# Patient Record
Sex: Female | Born: 1950 | Race: White | Hispanic: No | Smoking: Never smoker
Health system: Southern US, Community
[De-identification: ages and names within clinical notes are randomized; demographics above are authoritative.]

## PROBLEM LIST (undated history)

## (undated) DIAGNOSIS — L509 Urticaria, unspecified: Secondary | ICD-10-CM

## (undated) DIAGNOSIS — S343XXA Injury of cauda equina, initial encounter: Secondary | ICD-10-CM

## (undated) DIAGNOSIS — T783XXA Angioneurotic edema, initial encounter: Secondary | ICD-10-CM

## (undated) HISTORY — PX: TONSILLECTOMY: SUR1361

## (undated) HISTORY — DX: Urticaria, unspecified: L50.9

## (undated) HISTORY — DX: Angioneurotic edema, initial encounter: T78.3XXA

## (undated) HISTORY — PX: COLONOSCOPY: SHX174

## (undated) HISTORY — PX: OTHER SURGICAL HISTORY: SHX169

---

## 2011-04-07 ENCOUNTER — Emergency Department (HOSPITAL_COMMUNITY): Payer: Worker's Compensation

## 2011-04-07 ENCOUNTER — Emergency Department (HOSPITAL_COMMUNITY)
Admission: EM | Admit: 2011-04-07 | Discharge: 2011-04-07 | Disposition: A | Discharge status: Home / Self Care | Payer: Worker's Compensation | Attending: Emergency Medicine | Admitting: Emergency Medicine

## 2011-04-07 ENCOUNTER — Encounter (HOSPITAL_COMMUNITY): Payer: Self-pay | Admitting: Emergency Medicine

## 2011-04-07 DIAGNOSIS — M79609 Pain in unspecified limb: Secondary | ICD-10-CM | POA: Insufficient documentation

## 2011-04-07 DIAGNOSIS — W19XXXA Unspecified fall, initial encounter: Secondary | ICD-10-CM

## 2011-04-07 DIAGNOSIS — W010XXA Fall on same level from slipping, tripping and stumbling without subsequent striking against object, initial encounter: Secondary | ICD-10-CM | POA: Insufficient documentation

## 2011-04-07 DIAGNOSIS — S60229A Contusion of unspecified hand, initial encounter: Secondary | ICD-10-CM | POA: Insufficient documentation

## 2011-04-07 DIAGNOSIS — S0003XA Contusion of scalp, initial encounter: Secondary | ICD-10-CM | POA: Insufficient documentation

## 2011-04-07 DIAGNOSIS — Y92410 Unspecified street and highway as the place of occurrence of the external cause: Secondary | ICD-10-CM | POA: Insufficient documentation

## 2011-04-07 DIAGNOSIS — S6990XA Unspecified injury of unspecified wrist, hand and finger(s), initial encounter: Secondary | ICD-10-CM | POA: Insufficient documentation

## 2011-04-07 DIAGNOSIS — S0180XA Unspecified open wound of other part of head, initial encounter: Secondary | ICD-10-CM | POA: Insufficient documentation

## 2011-04-07 DIAGNOSIS — M7989 Other specified soft tissue disorders: Secondary | ICD-10-CM | POA: Insufficient documentation

## 2011-04-07 HISTORY — DX: Injury of cauda equina, initial encounter: S34.3XXA

## 2011-04-07 NOTE — ED Provider Notes (Signed)
History     CSN: [id number]  Arrival date & time 04/07/11  4098   First MD Initiated Contact with Patient 04/07/11 [phone]      Chief Complaint  Patient presents with  . Hand Injury  . Fall    (Consider location/radiation/quality/duration/timing/severity/associated sxs/prior treatment) Patient is a 61 y.o. female presenting with hand injury and fall. The history is provided by the patient.  Hand Injury  The incident occurred less than 1 hour ago. The incident occurred in the street. Injury mechanism: Walking and tripped falling directly onto the concrete. The pain is present in the right hand (Right face). The quality of the pain is described as sharp and throbbing. The pain is at a severity of 7/10. The pain is moderate. The pain has been constant since the incident. She reports no foreign bodies present. The symptoms are aggravated by movement, use and palpation. She has tried elevation and ice for the symptoms. The treatment provided mild relief.  Fall The accident occurred less than 1 hour ago. The fall occurred while walking. She landed on concrete. The volume of blood lost was minimal. The point of impact was the head. The pain is present in the head (Right hand). She was ambulatory at the scene. Pertinent negatives include no visual change, no headaches, no loss of consciousness and no tingling. The symptoms are aggravated by activity and use of the injured limb. She has tried ice for the symptoms.    Past Medical History  Diagnosis Date  . 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 Colonoscopy   . Tonsillectomy   . "sensitive to anesthesia"     Family History  Problem Relation Age of Onset  . Hypertension Mother   . Diabetes Mother   . Hyperlipidemia Mother   . Cancer Father   . Multiple sclerosis Father     History  Substance Use Topics  . Smoking status: Not on file  . Smokeless tobacco: Not  on file  . Alcohol Use: 0.6 oz/week    1 Glasses of wine per week     A glass with dinner    OB History    Grav Para Term Preterm Abortions TAB SAB Ect Mult Living                  Review of Systems  Neurological: Negative for tingling, loss of consciousness and headaches.  All other systems reviewed and are negative.    Allergies  Review of patient's allergies indicates no known allergies.  Home Medications  No current outpatient prescriptions on file.  BP 144/65  Pulse 66  Temp(Src) 98.2 F (36.8 C) (Oral)  Resp 18  Ht 5\' 9"  (1.753 m)  Wt 170 lb (77.111 kg)  BMI 25.10 kg/m2  SpO2 100%  Physical Exam  Nursing note and vitals reviewed. Constitutional: She is oriented to person, place, and time. She appears well-developed and well-nourished. No distress.  HENT:  Head: Normocephalic. Head is with contusion and with laceration.    Eyes: EOM are normal. Pupils are equal, round, and reactive to light.  Neck: No spinous process tenderness and no muscular tenderness present.  Cardiovascular: Normal rate, regular rhythm, normal heart sounds and intact distal pulses.  Exam reveals no friction rub.   No murmur heard. Pulmonary/Chest: Effort normal and breath sounds normal. She has no wheezes. She has no rales.  Abdominal: Soft. Bowel sounds are  normal. She exhibits no distension. There is no tenderness. There is no rebound and no guarding.  Musculoskeletal: Normal range of motion. She exhibits tenderness.       Right hand: She exhibits tenderness, bony tenderness, deformity and swelling. She exhibits normal capillary refill.       Hands:      No edema  Neurological: She is alert and oriented to person, place, and time. No cranial nerve deficit.  Skin: Skin is warm and dry. No rash noted.  Psychiatric: She has a normal mood and affect. Her behavior is normal.    ED Course  Procedures (including critical care time)  Labs Reviewed - No data to display Ct Head Wo  Contrast  04/07/2011  *RADIOLOGY REPORT*  Clinical Data: Fall, headache.  CT HEAD WITHOUT CONTRAST  Technique:  Contiguous axial images were obtained from the base of the skull through the vertex without contrast.  Comparison: None.  Findings: Mild generalized atrophy, slightly more pronounced in the occipital lobes. No acute intracranial abnormality.  Specifically, no hemorrhage, hydrocephalus, mass lesion, acute infarction, or significant intracranial injury.  No acute calvarial abnormality. Visualized paranasal sinuses and mastoids clear.  Orbital soft tissues unremarkable.  IMPRESSION: No acute intracranial abnormality.  Mild generalized atrophy.  Original Report Authenticated By: Cyndie Chime, M.D.   Dg Hand Complete Right  04/07/2011  *RADIOLOGY REPORT*  Clinical Data: Hand injury, fall.  RIGHT HAND - COMPLETE 3+ VIEW  Comparison: None  Findings: No acute bony abnormality.  Specifically, no fracture, subluxation, or dislocation.  Soft tissues are intact.  IMPRESSION: No acute bony abnormality.  Original Report Authenticated By: Cyndie Chime, M.D.     No diagnosis found.    MDM   Patient with a mechanical fall today onto the concrete. She hit her head with some history of being dazed but no LOC. Also injury to her hand that is consistent with a boxer's fracture. When patient flexes the fingers she has an impressive amount of lateral deviation of her fifth digit. No other signs of injury. No neck tenderness or pain. CT of the head and plain films of the hand pending. Patient is on no anticoagulation.  Tetanus up-to-date. Films negative hand wrapped wound cleaned and patient discharged home.        Gwyneth Sprout, MD 04/07/11 1128

## 2011-04-07 NOTE — ED Notes (Signed)
Pt states she tripped over a step and fell, struck the right side of her head. Small abrasion to right brow. C/o pain to right medial aspect of hand with some numbness. Adequate pulse, motor, sensory.

## 2011-04-07 NOTE — ED Notes (Signed)
Pt verbalized understanding of discharge instructions. Ambulated out of department with no difficulty.

## 2011-04-07 NOTE — ED Notes (Signed)
Ace wrap applied to right hand, strong pulse, motor, and sensory before and after application.

## 2011-04-07 NOTE — Discharge Instructions (Signed)
Contusion (Bruise) of Hand An injury to the hand may cause bruises (contusions). Contusions are caused by bleeding from small blood vessels (capillaries) that allow blood to leak out into the muscles, tendons, and surrounding soft tissue. This is followed by swelling and pain (inflammation). Contusions of the hand are common because of the use of hands in daily and recreational activities. Signs of a hand injury include pain, swelling, and a color change. Initially the skin may turn blue to purple in color. As the bruise ages, the color turns yellow and orange. Swelling may decrease the movement of the fingers. Contusions are seen more commonly with:  Contact sports (especially in football, wrestling, and basketball).   Use of medications that thin the blood (anticoagulants).   Use of aspirin and nonsteroidal anti-inflammatory agents that decrease the ability of the blood to clot.   Vitamin deficiencies.   Aging.  DIAGNOSIS  Diagnosis of hand injuries can be made by your own observation. If problems continue, a caregiver may be required for further evaluation and treatment. X-rays may be required to make sure there are no broken bones (fractures). Continued problems may require physical therapy for treatment. RISKS AND COMPLICATIONS  Extensive bleeding and tissue inflammation. This can lead to disability and arthritis-type problems later on if the hand does not heal properly.   Infection of the hand if there are breaks in the skin. This is especially true if the hand injury came from someone's teeth, such as would occur with punching someone in the mouth. This can lead to an infection of the tendons and the membranes surrounding the tendons (sheaths). This infection can have severe complications including a loss of function (a "frozen" hand).   Rupture of the tendons requiring a surgical repair. Failure to repair the tendons can result in loss of function of the hand or fingers.  HOME CARE  INSTRUCTIONS   Apply ice to the injury for 15 to 20 minutes, 3 to 4 times per day. Put the ice in a plastic bag and place a towel between the bag of ice and your skin.   An elastic bandage may be used initially for support and to minimize swelling. Do not wrap the hand too tightly. Do not sleep with the elastic bandage on.   Gentle massage from the fingertips towards the elbow will help keep the swelling down. Gently open and close your fist while doing this to maintain range of motion. Do this only after the first few days, when there is no or minimal pain.   Keep your hand above the level of the heart when swelling and pain are present. This will allow the fluid to drain out of the hand, decreasing the amount of swelling. This will improve healing time.   Try to avoid use of the injured hand (except for gentle range of motion) while the hand is hurting. Do not resume use until instructed by your caregiver. Then begin use gradually, do not increase use to the point of pain. If pain does develop, decrease use and continue the above measures, gradually increasing activities that do not cause discomfort until you achieve normal use.   Only take over-the-counter or prescription medicines for pain, discomfort, or fever as directed by your caregiver.   Follow up with your caregiver as directed. Follow-up care may include orthopedic referrals, physical therapy, and rehabilitation. Any delay in obtaining necessary care could result in delayed healing, or temporary or permanent disability.  REHABILITATION  Begin daily rehabilitation exercises when   an elastic bandage is no longer needed and you are either pain free or only have minimal pain.   Use ice massage for 10 minutes before and after workouts. Put ice in a plastic bag and place a towel between the bag of ice and your skin. Massage the injured area with the ice pack.  SEEK IMMEDIATE MEDICAL CARE IF:   Your pain and swelling increase, or pain is  uncontrolled with medications.   You have loss of feeling in your hand, or your hand turns cold or blue.   An oral temperature above 102 F (38.9 C) develops, not controlled by medication.   Your hand becomes warm to the touch, or you have increased pain with even slight movement of your fingers.   Your hand does not begin to improve in 1 or 2 days.   The skin is broken and signs of infection occur (fluid draining from the contusion, increasing pain, fever, headache, muscle aches, dizziness, or a general ill feeling).   You develop new, unexplained problems, or an increase of the symptoms that brought you to your caregiver.  MAKE SURE YOU:   Understand these instructions.   Will watch your condition.   Will get help right away if you are not doing well or get worse.  Document Released: 07/15/2001 Document Revised: 10/05/2010 Document Reviewed: 07/02/2009 [employer] Patient Information 2012 [employer]. 

## 2012-06-21 ENCOUNTER — Other Ambulatory Visit: Payer: Self-pay | Admitting: Family Medicine

## 2012-06-21 ENCOUNTER — Other Ambulatory Visit (HOSPITAL_COMMUNITY)
Admission: RE | Admit: 2012-06-21 | Discharge: 2012-06-21 | Disposition: A | Discharge status: Home / Self Care | Payer: 59 | Source: Ambulatory Visit | Attending: Family Medicine | Admitting: Family Medicine

## 2012-06-21 DIAGNOSIS — Z01419 Encounter for gynecological examination (general) (routine) without abnormal findings: Secondary | ICD-10-CM | POA: Insufficient documentation

## 2014-02-10 ENCOUNTER — Other Ambulatory Visit: Payer: Self-pay | Admitting: Gastroenterology

## 2015-04-29 ENCOUNTER — Other Ambulatory Visit: Payer: Self-pay | Admitting: Nurse Practitioner

## 2015-04-29 ENCOUNTER — Ambulatory Visit
Admission: RE | Admit: 2015-04-29 | Discharge: 2015-04-29 | Disposition: A | Discharge status: Home / Self Care | Payer: Worker's Compensation | Source: Ambulatory Visit | Attending: Nurse Practitioner | Admitting: Nurse Practitioner

## 2015-04-29 DIAGNOSIS — R609 Edema, unspecified: Secondary | ICD-10-CM

## 2015-04-29 DIAGNOSIS — R52 Pain, unspecified: Secondary | ICD-10-CM

## 2015-04-29 DIAGNOSIS — W19XXXA Unspecified fall, initial encounter: Secondary | ICD-10-CM

## 2016-03-27 ENCOUNTER — Ambulatory Visit (INDEPENDENT_AMBULATORY_CARE_PROVIDER_SITE_OTHER): Payer: 59 | Admitting: Allergy and Immunology

## 2016-03-27 ENCOUNTER — Encounter (INDEPENDENT_AMBULATORY_CARE_PROVIDER_SITE_OTHER): Payer: Self-pay

## 2016-03-27 ENCOUNTER — Encounter: Payer: Self-pay | Admitting: Allergy and Immunology

## 2016-03-27 VITALS — BP 132/70 | HR 86 | Temp 99.7°F | Resp 16 | Ht 67.5 in | Wt 191.6 lb

## 2016-03-27 DIAGNOSIS — T783XXA Angioneurotic edema, initial encounter: Secondary | ICD-10-CM | POA: Diagnosis not present

## 2016-03-27 DIAGNOSIS — J3089 Other allergic rhinitis: Secondary | ICD-10-CM | POA: Insufficient documentation

## 2016-03-27 DIAGNOSIS — T7840XA Allergy, unspecified, initial encounter: Secondary | ICD-10-CM | POA: Diagnosis not present

## 2016-03-27 DIAGNOSIS — L500 Allergic urticaria: Secondary | ICD-10-CM

## 2016-03-27 MED ORDER — LEVOCETIRIZINE DIHYDROCHLORIDE 5 MG PO TABS: 5.0000 mg | ORAL_TABLET | Freq: Every evening | ORAL | 5 refills | Status: DC

## 2016-03-27 MED ORDER — EPINEPHRINE 0.3 MG/0.3ML IJ SOAJ: 0.3000 mg | Freq: Once | INTRAMUSCULAR | 2 refills | Status: AC

## 2016-03-27 NOTE — Patient Instructions (Addendum)
Allergic reaction The patient's history suggests allergic reaction with an unclear trigger. Food allergen skin tests were negative today despite a positive histamine control. The negative predictive value for skin tests is excellent (greater than 95%). We will proceed with in vitro lab studies to clarify the etiology.  The following labs have been ordered: FCeRI antibody, TSH, anti-thyroglobulin antibody, thyroid peroxidase antibody, baseline serum tryptase, C4, C1 esterase inhibitor (quantitative and functional), C1q, factor XII, CBC, CMP, ESR, ANA, and serum specific IgE against nut panel, fish panel, and galactose-alpha-1,3-galactose. An additional lab order for serum tryptase has been provided which is to be kept by the patient to be drawn in the emergency department within 4 hours of symptom onset should symptoms recur.  Should symptoms recur, the patient has been asked to keep a journal to record any foods eaten, beverages consumed, medications taken within a 6 hour period prior to the onset of symptoms, as well as record activities being performed, and environmental conditions. For any symptoms concerning for anaphylaxis, epinephrine is to be administered and 911 is to be called immediately.  A prescription has been provided for epinephrine 0.3 mg autoinjector 2 pack along with instructions for its proper administration.  Recurrent urticaria/pruritus Skin tests to select food allergens were negative today. NSAIDs and emotional stress commonly exacerbate urticaria but are not the underlying etiology in this case. Physical urticarias are negative by history (i.e. pressure-induced, temperature, vibration, solar, etc.). We will rule out other potential etiologies with labs. For symptom relief, patient is to take oral antihistamines as directed.  Laboratory evaluation has been ordered (as above).    The patient will be called with further recommendations after lab results have returned.  A  prescription has been provided for levocetirizine, 78m daily as needed.  Perennial allergic rhinitis  Aeroallergen avoidance measures have been discussed and provided in written form.  A prescription has been provided for levocetirizine (as above).  A prescription has been provided for fluticasone nasal spray, 2 sprays per nostril daily as needed. Proper nasal spray technique has been discussed and demonstrated.  I have also recommended nasal saline spray (i.e., Simply Saline) or nasal saline lavage (i.e., NeilMed) as needed prior to medicated nasal sprays.   When lab results have returned the patient will be called with further recommendations and follow up instructions.

## 2016-03-27 NOTE — Assessment & Plan Note (Signed)
   Aeroallergen avoidance measures have been discussed and provided in written form.  A prescription has been provided for levocetirizine (as above).  A prescription has been provided for fluticasone nasal spray, 2 sprays per nostril daily as needed. Proper nasal spray technique has been discussed and demonstrated.  I have also recommended nasal saline spray (i.e., Simply Saline) or nasal saline lavage (i.e., NeilMed) as needed prior to medicated nasal sprays. 

## 2016-03-27 NOTE — Assessment & Plan Note (Signed)
Skin tests to select food allergens were negative today. NSAIDs and emotional stress commonly exacerbate urticaria but are not the underlying etiology in this case. Physical urticarias are negative by history (i.e. pressure-induced, temperature, vibration, solar, etc.). We will rule out other potential etiologies with labs. For symptom relief, patient is to take oral antihistamines as directed.  Laboratory evaluation has been ordered (as above).    The patient will be called with further recommendations after lab results have returned.  A prescription has been provided for levocetirizine, 5mg  daily as needed.

## 2016-03-27 NOTE — Assessment & Plan Note (Addendum)
The patient's history suggests allergic reaction with an unclear trigger. Food allergen skin tests were negative today despite a positive histamine control. The negative predictive value for skin tests is excellent (greater than 95%). We will proceed with in vitro lab studies to clarify the etiology.  The following labs have been ordered: FCeRI antibody, TSH, anti-thyroglobulin antibody, thyroid peroxidase antibody, baseline serum tryptase, C4, C1 esterase inhibitor (quantitative and functional), C1q, factor XII, CBC, CMP, ESR, ANA, and serum specific IgE against nut panel, fish panel, and galactose-alpha-1,3-galactose. An additional lab order for serum tryptase has been provided which is to be kept by the patient to be drawn in the emergency department within 4 hours of symptom onset should symptoms recur.  Should symptoms recur, the patient has been asked to keep a journal to record any foods eaten, beverages consumed, medications taken within a 6 hour period prior to the onset of symptoms, as well as record activities being performed, and environmental conditions. For any symptoms concerning for anaphylaxis, epinephrine is to be administered and 911 is to be called immediately.  A prescription has been provided for epinephrine 0.3 mg autoinjector 2 pack along with instructions for its proper administration.

## 2016-03-27 NOTE — Progress Notes (Signed)
  New Patient Note  RE: Stephanie Briggs MRN: [id number] DOB: 06/07/1950 Date of Office Visit: 03/27/2016  Referring provider: Sun, Vyvyan, MD Primary care provider: SUN,VYVYAN Y, MD  Chief Complaint: Allergic Reaction; Angioedema; and Urticaria   History of present illness: Stephanie Briggs is a 65 y.o. female seen today in consultation requested by Dorothy Scifres, PA-C. She reports that on 02/07/2016 she developed tongue swelling and throat swelling.  She states that the sensation in her throat felt like "having a goiter" and she had difficulty swallowing but denies dyspnea, chest tightness, or wheezing.  She drank icewater and took diphenhydramine and her symptoms resolved completely within 2 or 3 hours without further medical intervention.  This event occurred in mid morning and prior to the onset of symptoms she had consumed blueberries, smoked salmon, and mixed nuts.  She had consumed the nuts approximately 20 or 30 minutes prior to symptom onset.  She has largely avoided nuts since that time and has not had recurrence of angioedema.  She also reports that over the past year she has experienced recurrent episodes of hives that predominantly occur on her chest, neck, face, or head.  No specific medication, food, skin care product, detergent, soap, or other environmental triggers have been identified.  She currently applies topical corticosteroid and/or takes cetirizine with adequate relief. Ivah experiences nasal congestion, rhinorrhea, sneezing, nasal pruritus, and ocular pruritus as well as occasional sinus pressure.  These symptoms occur year around but are most pronounced in the springtime.   Assessment and plan: Allergic reaction The patient's history suggests allergic reaction with an unclear trigger. Food allergen skin tests were negative today despite a positive histamine control. The negative predictive value for skin tests is excellent (greater than 95%). We will proceed  with in vitro lab studies to clarify the etiology.  The following labs have been ordered: FCeRI antibody, TSH, anti-thyroglobulin antibody, thyroid peroxidase antibody, baseline serum tryptase, C4, C1 esterase inhibitor (quantitative and functional), C1q, factor XII, CBC, CMP, ESR, ANA, and serum specific IgE against nut panel, fish panel, and galactose-alpha-1,3-galactose. An additional lab order for serum tryptase has been provided which is to be kept by the patient to be drawn in the emergency department within 4 hours of symptom onset should symptoms recur.  Should symptoms recur, the patient has been asked to keep a journal to record any foods eaten, beverages consumed, medications taken within a 6 hour period prior to the onset of symptoms, as well as record activities being performed, and environmental conditions. For any symptoms concerning for anaphylaxis, epinephrine is to be administered and 911 is to be called immediately.  A prescription has been provided for epinephrine 0.3 mg autoinjector 2 pack along with instructions for its proper administration.  Recurrent urticaria/pruritus Skin tests to select food allergens were negative today. NSAIDs and emotional stress commonly exacerbate urticaria but are not the underlying etiology in this case. Physical urticarias are negative by history (i.e. pressure-induced, temperature, vibration, solar, etc.). We will rule out other potential etiologies with labs. For symptom relief, patient is to take oral antihistamines as directed.  Laboratory evaluation has been ordered (as above).    The patient will be called with further recommendations after lab results have returned.  A prescription has been provided for levocetirizine, 5mg daily as needed.  Perennial allergic rhinitis  Aeroallergen avoidance measures have been discussed and provided in written form.  A prescription has been provided for levocetirizine (as above).  A prescription has  been provided for   fluticasone nasal spray, 2 sprays per nostril daily as needed. Proper nasal spray technique has been discussed and demonstrated.  I have also recommended nasal saline spray (i.e., Simply Saline) or nasal saline lavage (i.e., NeilMed) as needed prior to medicated nasal sprays.   Meds ordered this encounter  Medications  . levocetirizine (XYZAL) 5 MG tablet    Sig: Take 1 tablet (5 mg total) by mouth every evening.    Dispense:  30 tablet    Refill:  5  . EPINEPHrine (EPIPEN 2-PAK) 0.3 mg/0.3 mL IJ SOAJ injection    Sig: Inject 0.3 mLs (0.3 mg total) into the muscle once.    Dispense:  2 Device    Refill:  2    Place on hold until patient calls.    Diagnostics: Environmental skin testing: Positive to dust mite antigen. Food allergen skin testing:  Negative despite a positive histamine control.    Physical examination: Blood pressure 132/70, pulse 86, temperature 99.7 F (37.6 C), temperature source Oral, resp. rate 16, height 5' 7.5" (1.715 m), weight 191 lb 9.6 oz (86.9 kg), SpO2 95 %.  General: Alert, interactive, in no acute distress. HEENT: TMs pearly gray, turbinates mildly edematous without discharge, post-pharynx moderately erythematous. Neck: Supple without lymphadenopathy. Lungs: Clear to auscultation without wheezing, rhonchi or rales. CV: Normal S1, S2 without murmurs. Abdomen: Nondistended, nontender. Skin: Warm and dry, without lesions or rashes. Extremities:  No clubbing, cyanosis or edema. Neuro:   Grossly intact.  Review of systems:  Review of systems negative except as noted in HPI / PMHx or noted below: Review of Systems  Constitutional: Negative.   HENT: Negative.   Eyes: Negative.   Respiratory: Negative.   Cardiovascular: Negative.   Gastrointestinal: Negative.   Genitourinary: Negative.   Musculoskeletal: Negative.   Skin: Negative.   Neurological: Negative.   Endo/Heme/Allergies: Negative.   Psychiatric/Behavioral: Negative.       Past medical history:  Past Medical History:  Diagnosis Date  . Angio-edema   . 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 Urticaria     Past surgical history:  Past Surgical History:  Procedure Laterality Date  . "Sensitive to anesthesia"    . COLONOSCOPY    . TONSILLECTOMY      Family history: Family History  Problem Relation Age of Onset  . Hypertension Mother   . Diabetes Mother   . Hyperlipidemia Mother   . Cancer Father   . Multiple sclerosis Father   . Allergic rhinitis Neg Hx   . Angioedema Neg Hx   . Asthma Neg Hx   . Eczema Neg Hx   . Immunodeficiency Neg Hx   . Urticaria Neg Hx     Social history: Social History   Social History  . Marital status: Divorced    Spouse name: N/A  . Number of children: N/A  . Years of education: N/A   Occupational History  . Not on file.   Social History Main Topics  . Smoking status: Never Smoker  . Smokeless tobacco: Never Used  . Alcohol use 0.6 oz/week    1 Glasses of wine per week     Comment: A glass with dinner  . Drug use: No  . Sexual activity: Not on file   Other Topics Concern  . Not on file   Social History Narrative  . No narrative on file   Environmental History: The patient lives in an apartment with carpeting throughout and  central air/heat.  There is no known water damage or mold issue in the apartment.  Her place of work does have water damage.  She is a nonsmoker without pets.  Allergies as of 03/27/2016      Reactions   Latex Hives      Medication List       Accurate as of 03/27/16 12:36 PM. Always use your most recent med list.          EPINEPHrine 0.3 mg/0.3 mL Soaj injection Commonly known as:  EPIPEN 2-PAK Inject 0.3 mLs (0.3 mg total) into the muscle once.   levocetirizine 5 MG tablet Commonly known as:  XYZAL Take 1 tablet (5 mg total) by mouth every evening.       Known medication allergies: Allergies  Allergen Reactions   . Latex Hives    I appreciate the opportunity to take part in Miral's care. Please do not hesitate to contact me with questions.  Sincerely,   R. Carter Bobbitt, MD 

## 2016-03-28 ENCOUNTER — Telehealth: Payer: Self-pay

## 2016-03-28 LAB — FACTOR 12 ASSAY: Factor XII Activity: 134 % (ref 50–150)

## 2016-03-28 LAB — C1 ESTERASE INHIBITOR: C1INH SerPl-mCnc: 25 mg/dL (ref 21–39)

## 2016-03-28 NOTE — Telephone Encounter (Signed)
Called [facility] and added Fish Panel [id number] per Dr. Nunzio CobbsBobbitt.

## 2016-04-04 ENCOUNTER — Encounter: Payer: Self-pay | Admitting: Neurology

## 2016-04-04 ENCOUNTER — Ambulatory Visit (INDEPENDENT_AMBULATORY_CARE_PROVIDER_SITE_OTHER): Payer: 59 | Admitting: Neurology

## 2016-04-04 VITALS — BP 142/71 | HR 69 | Ht 68.0 in | Wt 189.2 lb

## 2016-04-04 DIAGNOSIS — G834 Cauda equina syndrome: Secondary | ICD-10-CM

## 2016-04-04 DIAGNOSIS — M545 Low back pain: Secondary | ICD-10-CM | POA: Diagnosis not present

## 2016-04-04 DIAGNOSIS — G8929 Other chronic pain: Secondary | ICD-10-CM

## 2016-04-04 DIAGNOSIS — R32 Unspecified urinary incontinence: Secondary | ICD-10-CM

## 2016-04-04 DIAGNOSIS — R200 Anesthesia of skin: Secondary | ICD-10-CM | POA: Diagnosis not present

## 2016-04-04 NOTE — Patient Instructions (Addendum)
Remember to drink plenty of fluid, eat healthy meals and do not skip any meals. Try to eat protein with a every meal and eat a healthy snack such as fruit or nuts in between meals. Try to keep a regular sleep-wake schedule and try to exercise daily, particularly in the form of walking, 20-30 minutes a day, if you can.   As far as diagnostic testing: MRI Lumbar spine  I would like to see you back after imaging we will decide follow up, sooner if we need to. Please call us with any interim questions, concerns, problems, updates or refill requests.   Our phone number is [phone]. We also have an after hours call service for urgent matters and there is a physician on-call for urgent questions. For any emergencies you know to call 911 or go to the nearest emergency room  What is cauda equina syndrome?-The cauda equina is a bundle of nerves that spread out from the bottom of the spinal cord  Cauda equina syndrome is the medical term for a group of symptoms that happen when some of the nerves in the cauda equina get squeezed or damaged. What are the symptoms of cauda equina syndrome?-The symptoms of cauda equina syndrome include: ?Pain, numbness, or tingling in the lower back and spreading down 1 or both legs ?Leg weakness or a problem called "foot drop," which is when you cannot seem to hold your foot up (for example, while walking) ?Problems with bowel or bladder control ?Problems with sex What causes cauda equina syndrome?-To understand the causes of cauda equina syndrome, it's helpful to first learn a little about the back and spine. ?Vertebrae - A stack of bones that sit on top of one another like a stack of coins. Each of these bones has a hole in the center. When stacked, the bones form a hollow tube that protects the spinal cord. ?Spinal cord and nerves - The spinal cord runs through the vertebrae. Nerves branch from the spinal cord and pass in between the vertebrae. From there they  connect to the arms, the legs, and the organs. (This is why problems in the back can cause leg pain or bladder problems.) ?Discs - Rubbery discs sit in between each of the vertebrae to add cushion and allow movement. The discs that sit between the vertebrae have a tough outer shell and jelly-like center. The outer shell of the discs can sometimes break open, spilling the jelly material inside. This is what doctors call a "herniated disc." ?Muscles, tendons, and ligaments - Together the muscles, tendons, and ligaments are called the "soft tissues" of the back. These soft tissues support the back and help hold it together. Cauda equina syndrome can happen because of changes in the back that affect the nerves. For example, cauda equina syndrome can happen because of: ?A herniated disc - The material from a ruptured disc can press on or irritate the nerves in the cauda equina. ?Infection or inflammation - Different types of infection or inflammation can damage or irritate nerves in the cauda equina. ?Cancer - In people with cancer, tumors can form within the spine and press on the nerves in the cauda equina. ?Spinal stenosis - Spinal stenosis is a condition in which the vertebrae form bumps called bone spurs. These bumps can squeeze or press on the nerves in the cauda equina. In people with spinal stenosis, the discs between the vertebrae also tend to dry up and shrink. That causes the space between the vertebrae to get  smaller, sometimes pinching nerves. Should I see a doctor or nurse?-Yes, if you have symptoms of cauda equina syndrome, see a doctor or nurse right away. Cauda equina syndrome is a medical emergency. Will I need tests?-Yes. Your doctor or nurse will need to find out what is causing your symptoms. You will likely have an MRI. This is an imaging test that can create detailed pictures of the spinal cord and nearby tissues. How is cauda equina syndrome treated?-Treatment for cauda equina  syndrome involves treating whatever is affecting the nerves and causing the symptoms. Often, that means having surgery to remove bits of bone or discs, or tumors. If the cause of the cauda equina syndrome is a tumor, radiation to shrink the tumor might be an option. If the cause is an infection or inflammation, medicines to treat those problems might be needed.

## 2016-04-04 NOTE — Progress Notes (Signed)
[facility]    Provider:  Dr Lucia Gaskins Referring Provider: Orbie Hurst, Peter Minium Primary Care Physician:  Leanor Rubenstein, MD  CC:  Lightheadedness and cauda equina syndrome  HPI:  Stephanie Briggs is a 66 y.o. female here as a referral from Dr. Orbie Hurst for lightheadedness and cauda equina Patient has a past medical history of cauda equina syndrome, diarrhea, irregular heart rate. She was working in [place] in 2012. She had acute back issues and went to a sports medicine doctor and she went to the hospital for an MRI. She was diagnosed with Cauda Equina syndrome and she had PT for 6 months. She has numbness in the saddle area, worsening in the last few years, she has urinary incontinence and less sensation and warning with urination, she has a lot of numbness in the groin area. No weakness in the legs. Father had MS and Alzheimers. [place] is 56 and wonderful. She has lightheadedness sometimes, no significant issue, a little bit of memory fog, no particular complaints. She has tried OTC medication, heat, stretching, for years and the symptoms get worse. No tongoing, periodically. Worse with not eating or stress.   Reviewed notes, labs and imaging from outside physicians, which showed:  TSH 4.16January 2018, CBC with differential unremarkable January 2018, CMP unremarkable with BUN 17 and creatinine 0.83 November 2015, B12 653 November 2015  Patient reports story of "incidental cauda equina" seen on imaging in the past while living in [place]. She completed 4 months of physical therapy and has not ad regular back pain since. She's had several episodes of back pain recently along wit She requested referral to neurology. She developed tongue swelling which radiated to the neck and now she  Sensation a She continues to have difficulty making some sounds and she continues to have increased saliva production and has felt slightly spacey.  Reviewed images CT head and agree with the  following: 04/2011 Findings: Mild generalized atrophy, slightly more pronounced in the occipital lobes. No acute intracranial abnormality.  Specifically, no hemorrhage, hydrocephalus, mass lesion, acute infarction, or significant intracranial injury.  No acute calvarial abnormality. Visualized paranasal sinuses and mastoids clear.  Orbital soft tissues unremarkable.  IMPRESSION: No acute intracranial abnormality.  Mild generalized atrophy.  Review of Systems: Patient complains of symptoms per HPI as well as the following symptoms: no CP, no SOB. Pertinent negatives per HPI. All others negative.   Social History   Social History  . Marital status: Significant Other    Spouse name: N/A  . Number of children: 1  . Years of education: MPA   Occupational History  . [employer]    Social History Main Topics  . Smoking status: Never Smoker  . Smokeless tobacco: Never Used     Comment: Tried as a teenager  . Alcohol use 1.8 oz/week    3 Glasses of wine per week     Comment: A glass with dinner  . Drug use: No  . Sexual activity: Yes   Other Topics Concern  . Not on file   Social History Narrative   Lives at home alone   Right-handed   Caffeine: 1 large coffee per day + tea    Family History  Problem Relation Age of Onset  . Hypertension [place]   . Diabetes [place]   . Hyperlipidemia [place]   . Cancer Father     prostate, kidney, bladder  . Stroke Father   . Heart disease Father   . Colon cancer Paternal  Grandfather   . Allergic rhinitis Neg Hx   . Angioedema Neg Hx   . Asthma Neg Hx   . Eczema Neg Hx   . Immunodeficiency Neg Hx   . Urticaria Neg Hx     Past Medical History:  Diagnosis Date  . Angio-edema   . 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 Urticaria     Past Surgical History:  Procedure Laterality Date  . "Sensitive to anesthesia"    . COLONOSCOPY    . TONSILLECTOMY      Current  Outpatient Prescriptions  Medication Sig Dispense Refill  . levocetirizine (XYZAL) 5 MG tablet Take 1 tablet (5 mg total) by mouth every evening. (Patient not taking: Reported on 04/04/2016) 30 tablet 5   No current facility-administered medications for this visit.     Allergies as of 04/04/2016 - Review Complete 04/04/2016  Allergen Reaction Noted  . Latex Hives 03/27/2016    Vitals: BP (!) 142/71   Pulse 69   Ht 5\' 8"  (1.727 m)   Wt 189 lb 3.2 oz (85.8 kg)   BMI 28.77 kg/m  Last Weight:  Wt Readings from Last 1 Encounters:  04/04/16 189 lb 3.2 oz (85.8 kg)   Last Height:   Ht Readings from Last 1 Encounters:  04/04/16 5\' 8"  (1.727 m)   Physical exam: Exam: Gen: NAD, conversant, well nourised, obese, well groomed                     CV: RRR, no MRG. No Carotid Bruits. No peripheral edema, warm, nontender Eyes: Conjunctivae clear without exudates or hemorrhage  Neuro: Detailed Neurologic Exam  Speech:    Speech is normal; fluent and spontaneous with normal comprehension.  Cognition:    The patient is oriented to person, place, and time;     recent and remote memory intact;     language fluent;     normal attention, concentration,     fund of knowledge Cranial Nerves:    The pupils are equal, round, and reactive to light. The fundi are normal and spontaneous venous pulsations are present. Visual fields are full to finger confrontation. Extraocular movements are intact. Trigeminal sensation is intact and the muscles of mastication are normal. The face is symmetric. The palate elevates in the midline. Hearing intact. Voice is normal. Shoulder shrug is normal. The tongue has normal motion without fasciculations.   Coordination:    Normal finger to nose and heel to shin. Normal rapid alternating movements.   Gait:    Heel-toe and tandem gait are normal.   Motor Observation:    No asymmetry, no atrophy, and no involuntary movements noted. Tone:    Normal muscle tone.     Posture:    Posture is normal. normal erect    Strength: prox LE weakness otherwise strength is V/V in the upper and lower limbs.      Sensation: Saddle anesthesias     Reflex Exam:  DTR's:    Deep tendon reflexes in the upper and lower extremities are normal bilaterally.   Toes:    The toes are downgoing bilaterally.   Clonus:    Clonus is absent.       Assessment/Plan:  66 year old female with a past medical history of cauda equina syndrome here for worsening weakness in the legs, saddle anesthesia, Incontinence. Need an MRI of the lumbar spine to further evaluate for cauda equina syndrome and surgical options.  Patient instructed to proceed to the emergency room for any worsening symptoms.  Stephanie DeanAntonia Xhaiden Coombs, MD  [facility] [address] [place], [place] [id number]  Phone [phone] Fax [phone]

## 2016-04-05 ENCOUNTER — Telehealth: Payer: Self-pay | Admitting: Allergy and Immunology

## 2016-04-05 LAB — COMPREHENSIVE METABOLIC PANEL
ALT: 36 IU/L — ABNORMAL HIGH (ref 0–32)
AST: 26 IU/L (ref 0–40)
Albumin/Globulin Ratio: 1.8 (ref 1.2–2.2)
Albumin: 4.8 g/dL (ref 3.6–4.8)
Alkaline Phosphatase: 69 IU/L (ref 39–117)
BUN/Creatinine Ratio: 20 (ref 12–28)
BUN: 18 mg/dL (ref 8–27)
Bilirubin Total: 1 mg/dL (ref 0.0–1.2)
CO2: 24 mmol/L (ref 18–29)
Calcium: 10 mg/dL (ref 8.7–10.3)
Chloride: 100 mmol/L (ref 96–106)
Creatinine, Ser: 0.88 mg/dL (ref 0.57–1.00)
GFR calc Af Amer: 81 mL/min/{1.73_m2} (ref >59)
GFR calc non Af Amer: 70 mL/min/{1.73_m2} (ref >59)
Globulin, Total: 2.6 g/dL (ref 1.5–4.5)
Glucose: 103 mg/dL — ABNORMAL HIGH (ref 65–99)
Potassium: 4.1 mmol/L (ref 3.5–5.2)
Sodium: 142 mmol/L (ref 134–144)
Total Protein: 7.4 g/dL (ref 6.0–8.5)

## 2016-04-05 LAB — CBC WITH DIFFERENTIAL/PLATELET
Basophils Absolute: 0 10*3/uL (ref 0.0–0.2)
Basos: 0 %
EOS (ABSOLUTE): 0.1 10*3/uL (ref 0.0–0.4)
Eos: 1 %
Hematocrit: 44.6 % (ref 34.0–46.6)
Hemoglobin: 15 g/dL (ref 11.1–15.9)
Immature Grans (Abs): 0 10*3/uL (ref 0.0–0.1)
Immature Granulocytes: 0 %
Lymphocytes Absolute: 2.2 10*3/uL (ref 0.7–3.1)
Lymphs: 44 %
MCH: 30.5 pg (ref 26.6–33.0)
MCHC: 33.6 g/dL (ref 31.5–35.7)
MCV: 91 fL (ref 79–97)
Monocytes Absolute: 0.4 10*3/uL (ref 0.1–0.9)
Monocytes: 8 %
Neutrophils Absolute: 2.3 10*3/uL (ref 1.4–7.0)
Neutrophils: 47 %
Platelets: 232 10*3/uL (ref 150–379)
RBC: 4.92 x10E6/uL (ref 3.77–5.28)
RDW: 13.6 % (ref 12.3–15.4)
WBC: 5.1 10*3/uL (ref 3.4–10.8)

## 2016-04-05 LAB — CHRONIC URTICARIA: cu index: <3 (ref <10)

## 2016-04-05 LAB — ALLERGENS(7)
Brazil Nut IgE: <0.1 kU/L
F020-IgE Almond: <0.1 kU/L
F202-IgE Cashew Nut: <0.1 kU/L
Hazelnut (Filbert) IgE: <0.1 kU/L
Peanut IgE: <0.1 kU/L
Pecan Nut IgE: <0.1 kU/L
Walnut IgE: <0.1 kU/L

## 2016-04-05 LAB — ALPHA-GAL PANEL
Alpha Gal IgE*: <0.1 kU/L (ref <0.35)
Beef (Bos spp) IgE: <0.1 kU/L (ref <0.35)
Class Interpretation: 0
Class Interpretation: 0
Class Interpretation: 0
Lamb/Mutton (Ovis spp) IgE: <0.1 kU/L (ref <0.35)
Pork (Sus spp) IgE: <0.1 kU/L (ref <0.35)

## 2016-04-05 LAB — C1 ESTERASE INHIBITOR, FUNCTIONAL: C1INH Functional/C1INH Total MFr SerPl: >86 %mean normal

## 2016-04-05 LAB — TRYPTASE: Tryptase: 5.1 ug/L (ref 2.2–13.2)

## 2016-04-05 LAB — C4 COMPLEMENT: Complement C4, Serum: 34 mg/dL (ref 14–44)

## 2016-04-05 LAB — ANA W/REFLEX IF POSITIVE: Anti Nuclear Antibody(ANA): NEGATIVE

## 2016-04-05 LAB — COMPLEMENT COMPONENT C1Q: Complement C1Q: 15.4 mg/dL (ref 11.8–24.4)

## 2016-04-05 NOTE — Telephone Encounter (Signed)
Patient is returning a call about results.  

## 2016-04-06 ENCOUNTER — Telehealth: Payer: Self-pay | Admitting: Neurology

## 2016-04-06 NOTE — Telephone Encounter (Signed)
Left message for patient to call office.  

## 2016-04-06 NOTE — Telephone Encounter (Signed)
Pt returned Emily's call °

## 2016-04-07 ENCOUNTER — Encounter: Payer: Self-pay | Admitting: *Deleted

## 2016-04-07 NOTE — Telephone Encounter (Signed)
I spoke with Velna HatchetSheila and scheduled her MRI for 04/12/16 at our [facility] mobile unit.

## 2016-04-07 NOTE — Telephone Encounter (Signed)
See result note.  

## 2016-04-07 NOTE — Telephone Encounter (Signed)
Pt returned Emily's call. She can be reached at [phone] or (w) [phone]- she works 1/2 day today. I relayed VM was full and to check it.

## 2016-04-07 NOTE — Telephone Encounter (Signed)
I called the patient back and it went to her voicemail but then it said the mail box was full and I couldn't leave a message.

## 2016-04-12 ENCOUNTER — Ambulatory Visit (INDEPENDENT_AMBULATORY_CARE_PROVIDER_SITE_OTHER): Payer: 59

## 2016-04-12 DIAGNOSIS — R32 Unspecified urinary incontinence: Secondary | ICD-10-CM

## 2016-04-12 DIAGNOSIS — R200 Anesthesia of skin: Secondary | ICD-10-CM

## 2016-04-12 DIAGNOSIS — G834 Cauda equina syndrome: Secondary | ICD-10-CM

## 2016-04-12 DIAGNOSIS — G8929 Other chronic pain: Secondary | ICD-10-CM

## 2016-04-12 DIAGNOSIS — M545 Low back pain: Secondary | ICD-10-CM

## 2016-04-17 ENCOUNTER — Telehealth: Payer: Self-pay | Admitting: *Deleted

## 2016-04-17 NOTE — Telephone Encounter (Signed)
Left message for a return call to discuss results. 

## 2016-04-17 NOTE — Telephone Encounter (Signed)
-----   Message from Anson FretAntonia B Ahern, MD sent at 04/15/2016  5:24 PM EST ----- MRI lumbar spine with some degenerative/arthritic changes but no evidence of cauda equina syndrome. There may be some mild left L5 nerve root pinching.  Thanks.

## 2016-04-19 LAB — SPECIMEN STATUS REPORT

## 2016-04-19 LAB — ALLERGEN PROFILE, FOOD-FISH
Allergen Mackerel IgE: <0.1 kU/L
Allergen Salmon IgE: <0.1 kU/L
Allergen Trout IgE: <0.1 kU/L
Allergen Walley Pike IgE: <0.1 kU/L
Codfish IgE: <0.1 kU/L
Halibut IgE: <0.1 kU/L
Tuna: <0.1 kU/L

## 2016-04-19 NOTE — Telephone Encounter (Signed)
Pt returned call for MRI results. Verbalized understanding and appreciation for call.

## 2016-04-21 ENCOUNTER — Encounter: Payer: Self-pay | Admitting: Neurology

## 2016-04-21 ENCOUNTER — Encounter: Payer: Self-pay | Admitting: Allergy and Immunology

## 2016-04-21 DIAGNOSIS — R32 Unspecified urinary incontinence: Secondary | ICD-10-CM

## 2016-04-21 DIAGNOSIS — M545 Low back pain: Secondary | ICD-10-CM

## 2016-04-21 DIAGNOSIS — R200 Anesthesia of skin: Secondary | ICD-10-CM

## 2016-04-24 NOTE — Addendum Note (Signed)
Addended by: Donnelly AngelicaHOGAN, Ahtziri Jeffries L on: 04/24/2016 11:52 AM   Modules accepted: Orders

## 2016-05-03 ENCOUNTER — Ambulatory Visit: Payer: 59

## 2016-05-09 ENCOUNTER — Encounter: Payer: Self-pay | Admitting: Physical Therapy

## 2016-05-09 ENCOUNTER — Ambulatory Visit: Payer: 59 | Attending: Neurology | Admitting: Physical Therapy

## 2016-05-09 DIAGNOSIS — N393 Stress incontinence (female) (male): Secondary | ICD-10-CM | POA: Insufficient documentation

## 2016-05-09 DIAGNOSIS — M6281 Muscle weakness (generalized): Secondary | ICD-10-CM | POA: Diagnosis not present

## 2016-05-09 DIAGNOSIS — R279 Unspecified lack of coordination: Secondary | ICD-10-CM | POA: Diagnosis present

## 2016-05-09 NOTE — Patient Instructions (Signed)
Balloon Breath    Place hands LIGHTLY on belly below navel. Imagine a balloon inside belly. Blow up balloon on breath IN, deflate balloon on breath OUT. Contract abdominals slightly to assist breath OUT. Time __2_ minutes.  Copyright  VHI. All rights reserved.     Ball squeezes 10 x holding for 5 sec - you can use different size balls or just use a pillow    Transverse Abdominus Activation  Contract your lower abdominals as if you were trying to lift one leg from the table.  Initiate the movement but do no lift foot greater than 1 inch from the table.  Repeat opposite side. Repeat 20x each side  [employer] - look at pelvic floor strengthening and yoga videos

## 2016-05-15 NOTE — Therapy (Addendum)
[facility] [address] [place], [place], [address] Phone: [phone]   Fax:  [phone]  Physical Therapy Evaluation  Patient Details  Name: [place]gs MRN: [id number] Date of Birth: 27-Dec-1950 Referring Provider: Melvenia Beam  Encounter Date: 05/09/2016      PT End of Session - 05/15/16 1743    Visit Number 1   Date for PT Re-Evaluation 07/04/16   PT Start Time 1147   PT Stop Time 1236   PT Time Calculation (min) 49 min   Activity Tolerance Patient tolerated treatment well   Behavior During Therapy [facility] for tasks assessed/performed      Past Medical History:  Diagnosis Date  . Angio-edema   . 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 Urticaria     Past Surgical History:  Procedure Laterality Date  . "Sensitive to anesthesia"    . COLONOSCOPY    . TONSILLECTOMY      There were no vitals filed for this visit.       Subjective Assessment - 05/15/16 1740    Subjective Pt had cauda equina per MRI in [place] years ago.  Recent MRI shows no cauda equina but does have disc compression.  Has been doing exercise with yoga instructor.  Has a standing desk to avoid sitting too much. Last few years it was worse has experienced some stresses in life that have led to chronic fatigue   Limitations Sitting;Lifting   How long can you sit comfortably? 2 hours   How long can you walk comfortably? gets tired   Patient Stated Goals Know what she can do or not do to be safe from low back pain   Currently in Pain? Yes   Pain Score 2    Pain Location Back   Pain Orientation Mid;Lower   Pain Descriptors / Indicators Aching;Dull   Pain Type Chronic pain   Pain Onset More than a month ago   Pain Frequency Constant   Aggravating Factors  quick movement, sneezing, leaning forward   Pain Relieving Factors moving or changing positions   Effect of Pain on Daily Activities  none but not sure how to exercise   Multiple Pain Sites No            [facility] PT Assessment - 05/15/16 0001      Assessment   Medical Diagnosis R20.0 saddle anesthesia, R32 urinary incontinence, M54.5 midline low back pain   Onset Date/Surgical Date --  several years ago   Prior Therapy no     Precautions   Precautions None     Restrictions   Weight Bearing Restrictions No     Home Environment   Living Environment Private residence   Living Arrangements Spouse/significant other     Prior Function   Level of Independence Independent   Vocation Full time employment  55-60 hrs/week   Vocation Requirements sitting and standing     Cognition   Overall Cognitive Status Within Functional Limits for tasks assessed     Observation/Other Assessments   Focus on Therapeutic Outcomes (FOTO)  30% limited     AROM   Overall AROM  Deficits  lumbar flexion 50% limited and needs UE support up to stand     Strength   Overall Strength Deficits  hip add Rt 3/5; Lt 4/5; core 3/5     Palpation   Palpation comment difficulty  Pelvic Floor Special Questions - 05/15/16 0001    Urinary urgency Yes   Urinary frequency 3-4          [facility] Adult PT Treatment/Exercise - 05/15/16 0001      Ambulation/Gait   Gait Pattern Within Functional Limits     Posture/Postural Control   Posture/Postural Control Postural limitations   Postural Limitations Anterior pelvic tilt     Neuro Re-ed    Neuro Re-ed Details  initial HEP educated and performed                PT Education - 05/15/16 1742    Education provided Yes   Education Details see handout   Person(s) Educated Patient   Methods Explanation;Tactile cues;Verbal cues;Handout   Comprehension Verbalized understanding;Returned demonstration          PT Short Term Goals - 05/15/16 1726      PT SHORT TERM GOAL #1   Title independnet with initial HEP   Time 4   Period Weeks   Status New      PT SHORT TERM GOAL #2   Title be able to perform pelvic floor contraction without contracting glutes for improved coordination and stability during funcitonal movements   Time 4   Period Weeks   Status New           PT Long Term Goals - 05/15/16 1728      PT LONG TERM GOAL #1   Title be able to get in and out of bed without increased pain or difficulty due to improved core strength   Time 8   Period Weeks   Status New     PT LONG TERM GOAL #2   Title pt is confident to perform exercises that will be safe for her in order to advance her HEP independently   Time 8   Period Weeks   Status New     PT LONG TERM GOAL #3   Title pt will be able to resume regular exercise routine of walking without increased pain   Time 8   Period Weeks   Status New               Plan - 05/15/16 1721    Clinical Impression Statement Pt was seen for low complexity evaluation due to stable conditon.  Pt demonstrates weakness of core during functional movements and weakness of bilaterally from 3+/5. Pt has pain in low back that is constantly 2/10 . Pt is unsure of how she can move and what activities she can do so that she won't aggravate her pain. Pt has abnormal posture with increased anterior pelvic rotation  She demonstrates improved ease of hip flexion with compression of pelvis showing that she lacks stability in her core.  Pt will benefit from skilled PT to work on improving strength and muscle coordination for return to functional activities as part of healthy lifestyle.   Rehab Potential Good   PT Frequency 2x / week   PT Duration 8 weeks   PT Treatment/Interventions ADLs/Self Care Home Management;Biofeedback;Cryotherapy;Electrical Stimulation;Moist Heat;Stair training;Gait training;Therapeutic activities;Therapeutic exercise;Neuromuscular re-education;Patient/family education;Manual techniques;Passive range of motion;Dry needling;Taping   PT Next Visit Plan core strengthening, biofeedback  to assess pelvic floor strength and ability to perform contraction   Consulted and Agree with Plan of Care Patient      Patient will benefit from skilled therapeutic intervention in order to improve the following deficits and impairments:  Pain, Decreased strength, Impaired sensation, Postural dysfunction  Visit Diagnosis:  Muscle weakness (generalized)  Stress incontinence (female) (female)  Unspecified lack of coordination     Problem List Patient Active Problem List   Diagnosis Date Noted  . Allergic reaction 03/27/2016  . Recurrent urticaria/pruritus 03/27/2016  . Perennial allergic rhinitis 03/27/2016  . Angioedema 03/27/2016    Zannie Cove, PT 05/15/2016, 5:43 PM  [facility] [address], [place], [place], [address] Phone: [phone]   Fax:  [phone]  Name: [place]gs MRN: [id number] Date of Birth: 1950-03-31  PHYSICAL THERAPY DISCHARGE SUMMARY  Visits from Start of Care: 1  Current functional level related to goals / functional outcomes: See above, only came to eval   Remaining deficits: eval only   Education / Equipment: HEP  Plan: Patient agrees to discharge.  Patient goals were not met. Patient is being discharged due to not returning since the last visit.  ?????     Google, PT 02/28/17 9:08 AM

## 2016-05-17 ENCOUNTER — Encounter: Payer: Self-pay | Admitting: Physical Therapy

## 2016-05-23 ENCOUNTER — Encounter: Payer: Self-pay | Admitting: Physical Therapy

## 2016-05-30 ENCOUNTER — Encounter: Payer: Self-pay | Admitting: Physical Therapy

## 2016-06-06 ENCOUNTER — Encounter: Payer: Self-pay | Admitting: Physical Therapy

## 2016-06-13 ENCOUNTER — Encounter: Payer: Self-pay | Admitting: Physical Therapy

## 2017-02-28 NOTE — Addendum Note (Signed)
Addended by: Dorie RankROSSER, Jessy Cybulski D on: 02/28/2017 09:08 AM   Modules accepted: Orders

## 2017-08-23 IMAGING — CR DG WRIST COMPLETE 3+V*L*
4 series · 4 of 4 positions shown · non-contrast
Comparison: None.

CLINICAL DATA: Fall this morning, left wrist pain.

EXAM:
LEFT WRIST - COMPLETE 3+ VIEW

[x wrist pa left]
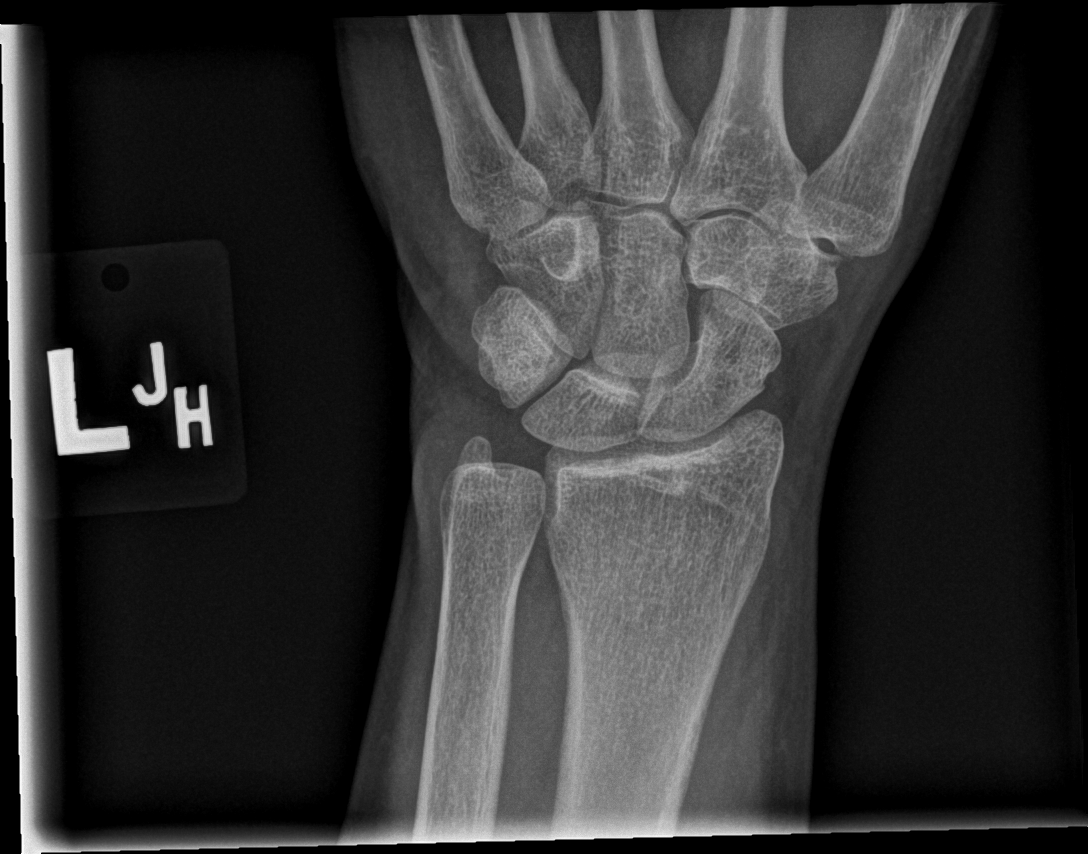

[x wrist navicular view left]
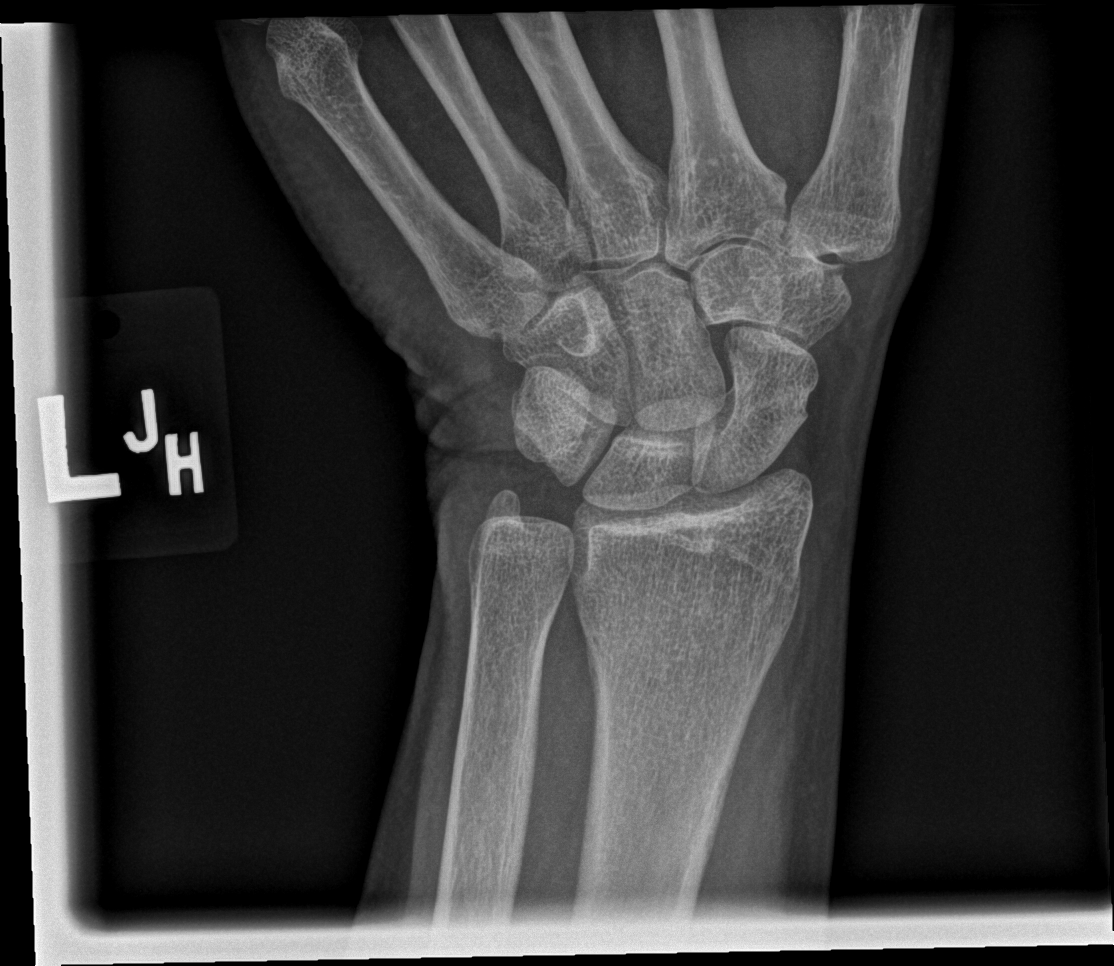

[x wrist obl left]
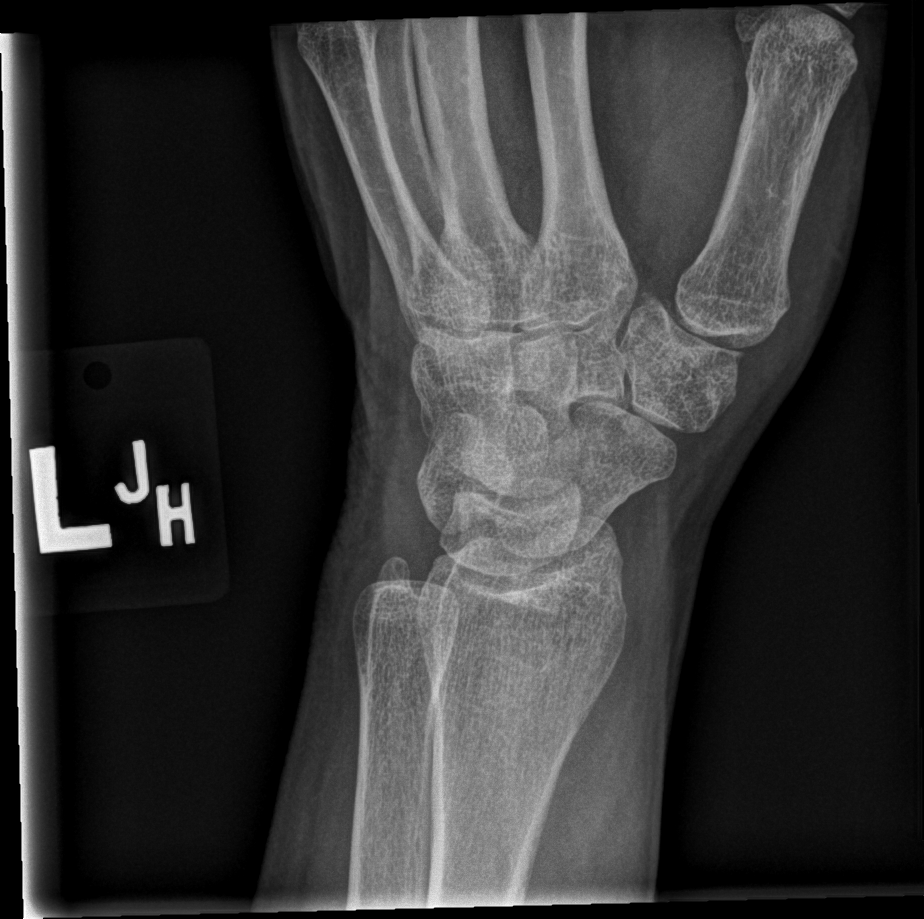

[x wrist lat left]
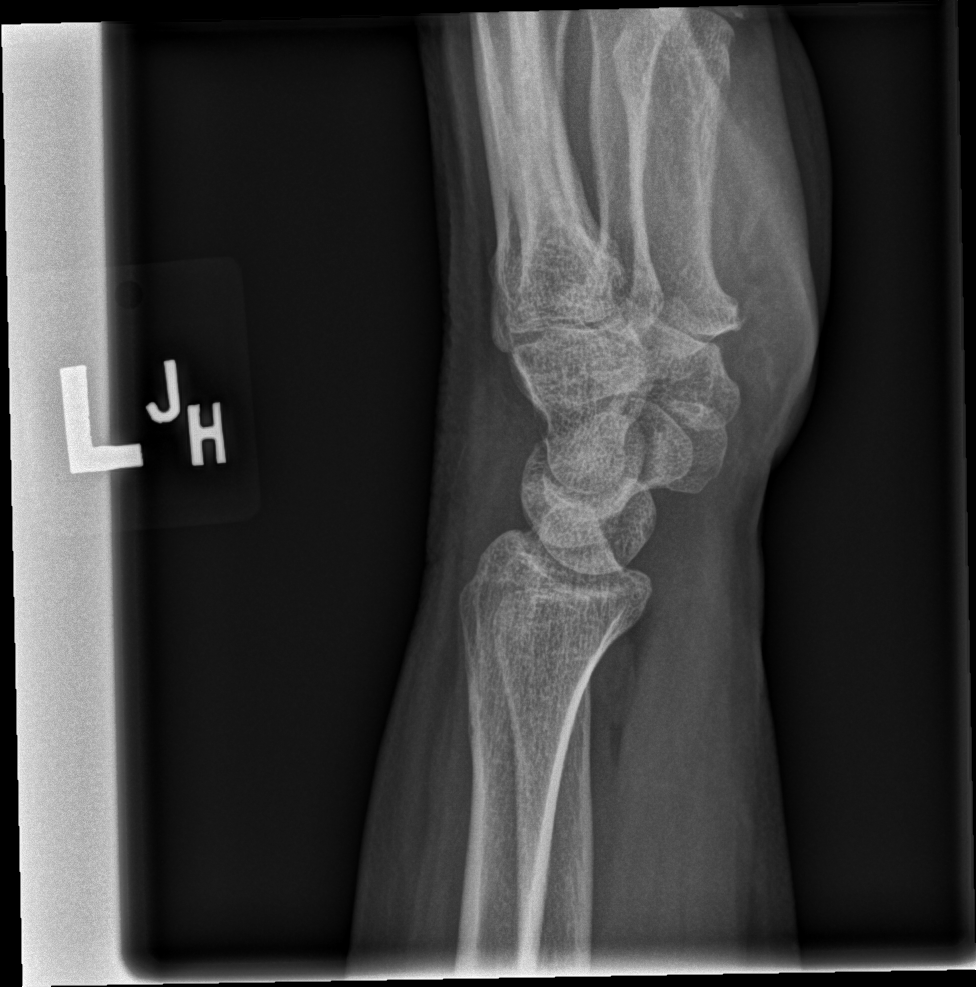

[4 of 4 positions shown; findings below may reference images not displayed]

FINDINGS: There is no evidence of fracture or dislocation. There is no
evidence of arthropathy or other focal bone abnormality. Soft
tissues are unremarkable.
IMPRESSION: Negative.

## 2022-10-16 NOTE — Progress Notes (Signed)
Sleep Medicine   Office Visit  Patient Name: Stephanie Briggs DOB: 06/17/1950 MRN [id number]    Chief Complaint: Sleep consult  Brief History:  Stephanie Briggs presents for an initial consult for sleep evaluation and to establish care. Patient has at least 1 year history of excessive daytime sleepiness that got worse since she had COVID May of 2023. Sleep quality is fair. This is noted most nights. The patient's bed partner reports  snoring at night. The patient relates the following symptoms: excessive fatigue, dizziness, brain fogginess, trouble concentrating and mild headaches are also present. The patient goes to sleep at 0830 pm and wakes up at 0700 am.  Sleep quality is the same when outside home environment.  Patient has noted no significant movement of her legs at night that would disrupt her sleep.  The patient  relates no unusual behavior during the night.  The patient relates no history of psychiatric problems. The Epworth Sleepiness Score is 7 out of 24.  The patient relates  Cardiovascular risk factors include: none.    ROS  General: (-) fever, (-) chills, (-) night sweat Nose and Sinuses: (-) nasal stuffiness or itchiness, (-) postnasal drip, (-) nosebleeds, (-) sinus trouble. Mouth and Throat: (-) sore throat, (-) hoarseness. Neck: (-) swollen glands, (-) enlarged thyroid, (-) neck pain. Respiratory: - cough, - shortness of breath, - wheezing. Neurologic: - numbness, - tingling. Psychiatric: - anxiety, - depression Sleep behavior: -sleep paralysis -hypnogogic hallucinations -dream enactment      -vivid dreams -cataplexy -night terrors -sleep walking   Current Medication: Outpatient Encounter Medications as of 10/17/2022  Medication Sig   [DISCONTINUED] levocetirizine (XYZAL) 5 MG tablet Take 1 tablet (5 mg total) by mouth every evening. (Patient not taking: Reported on 04/04/2016)   No facility-administered encounter medications on file as of 10/17/2022.    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)    Urticaria     Family History: Non contributory to the present illness  Social History: Social History   Socioeconomic History   Marital status: Significant Other    Spouse name: Not on file   Number of children: 1   Years of education: MPA   Highest education level: Not on file  Occupational History   Occupation: [employer]  Tobacco Use   Smoking status: Never   Smokeless tobacco: Never   Tobacco comments:    Tried as a teenager  Substance and Sexual Activity   Alcohol use: Yes    Alcohol/week: 3.0 standard drinks of alcohol    Types: 3 Glasses of wine per week    Comment: A glass with dinner   Drug use: No   Sexual activity: Yes  Other Topics Concern   Not on file  Social History Narrative   Lives at home alone   Right-handed   Caffeine: 1 large coffee per day + tea   Social Determinants of Health   Financial Resource Strain: Low Risk  (09/20/2020)   Received from [facility]   Overall Financial Resource Strain (CARDIA)    Difficulty of Paying Living Expenses: Not hard at all  Food Insecurity: No Food Insecurity (09/20/2020)   Received from [facility]   Hunger Vital Sign  Worried About Programme researcher, broadcasting/film/video in the Last Year: Never true    Ran Out of Food in the Last Year: Never true  Transportation Needs: No Transportation Needs (09/20/2020)   Received from [facility] - Transportation    Lack of Transportation (Medical): No    Lack of Transportation (Non-Medical): No  Physical Activity: Sufficiently Active (09/20/2020)   Received from [facility]   Exercise Vital Sign    Days of Exercise per Week: 4 days    Minutes of Exercise per Session: 40  min  Stress: Stress Concern Present (09/20/2020)   Received from [facility] - Occupational Stress Questionnaire    Feeling of Stress : To some extent  Social Connections: Unknown (06/18/2021)   Received from [facility]   Social Network    Social Network: Not on file  Intimate Partner Violence: Unknown (05/12/2021)   Received from [facility]   HITS    Physically Hurt: Not on file    Insult or Talk Down To: Not on file    Threaten Physical Harm: Not on file    Scream or Curse: Not on file    Vital Signs: Blood pressure 121/68, pulse (!) 56, resp. rate 16, height 5\' 9"  (1.753 m), weight 181 lb (82.1 kg), SpO2 98%. Body mass index is 26.73 kg/m.   Examination: General Appearance: The patient is well-developed, well-nourished, and in no distress. Neck Circumference: 38 cm Skin: Gross inspection of skin unremarkable. Head: normocephalic, no gross deformities. Eyes: no gross deformities noted. ENT: ears appear grossly normal Neurologic: Alert and oriented. No involuntary movements.    STOP BANG RISK ASSESSMENT S (snore) Have you been told that you snore?     YES   T (tired) Are you often tired, fatigued, or sleepy during the day?   YES  O (obstruction) Do you stop breathing, choke, or gasp during sleep? NO   P (pressure) Do you have or are you being treated for high blood pressure? YES   B (BMI) Is your body index greater than 35 kg/m? NO   A (age) Are you 70 years old or older? YES   N (neck) Do you have a neck circumference greater than 16 inches?   NO   G (gender) Are you a female? NO   TOTAL STOP/BANG "YES" ANSWERS 4                                                               A STOP-Bang score of 2 or less is considered low risk, and a score of 5 or more is high risk for having either moderate or severe OSA. For people who score 3 or 4, doctors may need to perform further assessment to determine how likely they are  to have OSA.         EPWORTH SLEEPINESS SCALE:  Scale:  (0)= no chance of dozing; (1)= slight chance of dozing; (2)= moderate chance of dozing; (3)= high chance of dozing  Chance  Situtation    Sitting and reading: 2    Watching TV: 0    Sitting Inactive in public: 0    As a passenger in car: 2      Lying down to rest:  3    Sitting and talking: 0    Sitting quielty after lunch: 0    In a car, stopped in traffic: 0   TOTAL SCORE:   7 out of 24    SLEEP STUDIES:  None   LABS: No results found for this or any previous visit (from the past 2160 hour(s)).  Radiology: DG Wrist Complete Left  Result Date: 04/29/2015 CLINICAL DATA:  Fall this morning, left wrist pain. EXAM: LEFT WRIST - COMPLETE 3+ VIEW COMPARISON:  None. FINDINGS: There is no evidence of fracture or dislocation. There is no evidence of arthropathy or other focal bone abnormality. Soft tissues are unremarkable. IMPRESSION: Negative. Electronically Signed   By: Bary Richard M.D.   On: 04/29/2015 17:33    No results found.  No results found.    Assessment and Plan: Patient Active Problem List   Diagnosis Date Noted   Allergic reaction 03/27/2016   Recurrent urticaria/pruritus 03/27/2016   Perennial allergic rhinitis 03/27/2016   Angioedema 03/27/2016     PLAN OSA:   Patient evaluation suggests high risk of sleep disordered breathing due to snoring, excessive fatigue, dizziness, brain fogginess, trouble concentrating, and mild headaches.   Suggest: PSG to assess/treat the patient's sleep disordered breathing. The patient was also counselled on wt loss to optimize sleep health.  1. Hypersomnia Will order PSG   General Counseling: I have discussed the findings of the evaluation and examination with Stephanie Briggs.  I have also discussed any further diagnostic evaluation thatmay be needed or ordered today. Stephanie Briggs verbalizes understanding of the findings of todays visit. We also reviewed her  medications today and discussed drug interactions and side effects including but not limited excessive drowsiness and altered mental states. We also discussed that there is always a risk not just to her but also people around her. she has been encouraged to call the office with any questions or concerns that should arise related to todays visit.  No orders of the defined types were placed in this encounter.       I have personally obtained a history, evaluated the patient, evaluated pertinent data, formulated the assessment and plan and placed orders.  This patient was seen by Lynn Ito, PA-C in collaboration with Dr. Freda Munro as a part of collaborative care agreement.    Yevonne Pax, MD [facility] Diplomate ABMS Pulmonary and Critical Care Medicine Sleep medicine

## 2022-10-17 ENCOUNTER — Ambulatory Visit (INDEPENDENT_AMBULATORY_CARE_PROVIDER_SITE_OTHER): Payer: Medicare Other | Admitting: Internal Medicine

## 2022-10-17 VITALS — BP 121/68 | HR 56 | Resp 16 | Ht 69.0 in | Wt 181.0 lb

## 2022-10-17 DIAGNOSIS — G4710 Hypersomnia, unspecified: Secondary | ICD-10-CM
